# Patient Record
Sex: Female | Born: 1975 | Race: Asian | Hispanic: No | Marital: Married | Smoking: Never smoker
Health system: Southern US, Community
[De-identification: ages and names within clinical notes are randomized; demographics above are authoritative.]

---

## 2012-01-23 ENCOUNTER — Ambulatory Visit: Payer: Self-pay | Admitting: Physician Assistant

## 2012-01-23 VITALS — BP 113/75 | HR 67 | Temp 98.6°F | Resp 16 | Ht 61.0 in | Wt 113.6 lb

## 2012-01-23 DIAGNOSIS — J029 Acute pharyngitis, unspecified: Secondary | ICD-10-CM

## 2012-01-23 DIAGNOSIS — J40 Bronchitis, not specified as acute or chronic: Secondary | ICD-10-CM

## 2012-01-23 MED ORDER — HYDROCODONE-HOMATROPINE 5-1.5 MG/5ML PO SYRP: ORAL_SOLUTION | ORAL | Status: AC

## 2012-01-23 MED ORDER — CEFDINIR 300 MG PO CAPS: 300.0000 mg | ORAL_CAPSULE | Freq: Two times a day (BID) | ORAL | Status: AC

## 2012-01-23 NOTE — Progress Notes (Signed)
Patient ID: Sonia Santiago MRN: [id number], DOB: [birth year], 36 y.o. Date of Encounter: 01/23/2012, 11:56 AM  Primary Physician: No primary provider on file.  Chief Complaint:  Chief Complaint  Patient presents with  . Sore Throat    x 2 weeks  . Fever  . Cough    HPI: 37 y.o. year old female presents with a 14 day history of nasal congestion, post nasal drip, sore throat, sinus pressure, and cough. Afebrile. No chills. Nasal congestion thick and green/yellow. Cough is mildly productive of green/yellow sputum and not associated with time of day. Ears feel full, leading to sensation of muffled hearing. Has tried OTC cold preps without success. No GI complaints. Appetite normal.  No sick contacts, recent antibiotics, or recent travels.   No leg trauma, sedentary periods, h/o cancer, or tobacco use.  History reviewed. No pertinent past medical history.   Home Meds: Prior to Admission medications   Medication Sig Start Date End Date Taking? Authorizing Provider  cefdinir (OMNICEF) 300 MG capsule Take 1 capsule (300 mg total) by mouth 2 (two) times daily. 01/23/12 02/02/12  Kory Panjwani M Leasia Swann, PA-C  HYDROcodone-homatropine (HYCODAN) 5-1.5 MG/5ML syrup 1 TSP PO Q 4-6 HOURS PRN COUGH 01/23/12 02/02/12  Sondra Barges, PA-C    Allergies: No Known Allergies  History   Social History  . Marital Status: Married    Spouse Name: N/A    Number of Children: N/A  . Years of Education: N/A   Occupational History  . Not on file.   Social History Main Topics  . Smoking status: Never Smoker   . Smokeless tobacco: Not on file  . Alcohol Use: Not on file  . Drug Use: Not on file  . Sexually Active: Not on file   Other Topics Concern  . Not on file   Social History Narrative  . No narrative on file     Review of Systems: Constitutional: negative for chills, fever, night sweats or weight changes Cardiovascular: negative for chest pain or palpitations Respiratory: negative for hemoptysis,  wheezing, or shortness of breath Abdominal: negative for abdominal pain, nausea, vomiting or diarrhea Dermatological: negative for rash Neurologic: negative for headache   Physical Exam: Blood pressure 113/75, pulse 67, temperature 98.6 F (37 C), temperature source Oral, resp. rate 16, height 5\' 1"  (1.549 m), weight 113 lb 9.6 oz (51.529 kg), last menstrual period 01/21/2012., Body mass index is 21.46 kg/(m^2). General: Well developed, well nourished, in no acute distress. Head: Normocephalic, atraumatic, eyes without discharge, sclera non-icteric, nares are congested. Bilateral auditory canals clear, TM's are without perforation, pearly grey with reflective cone of light bilaterally. Maxillary sinus TTP. Oral cavity moist, dentition normal. Posterior pharynx with post nasal drip and mild erythema. No peritonsillar abscess or tonsillar exudate. Neck: Supple. No thyromegaly. Full ROM. No lymphadenopathy. Lungs: Coarse breath sounds bilaterally without wheezes, rales, or rhonchi. Breathing is unlabored.  Heart: RRR with S1 S2. No murmurs, rubs, or gallops appreciated. Msk:  Strength and tone normal for age. Extremities: No clubbing or cyanosis. No edema. Neuro: Alert and oriented X 3. Moves all extremities spontaneously. CNII-XII grossly in tact. Psych:  Responds to questions appropriately with a normal affect.   Labs: Deferred secondary financial issues  ASSESSMENT AND PLAN:  36 y.o. year old female with sinobronchitis -Omnicef 300 mg 1 po bid #20 no RF  -Hycodan #4oz 1 tsp po q 4-6 hours prn cough no RF SED -Mucinex -Tylenol/Motrin prn -Rest/fluids -RTC precautions -RTC 3-5 days if  no improvement  Signed, Eula Listen, PA-C 01/23/2012 11:56 AM

## 2012-03-11 ENCOUNTER — Ambulatory Visit: Payer: Self-pay | Admitting: Family Medicine

## 2012-03-11 VITALS — BP 110/76 | HR 87 | Temp 98.1°F | Resp 16 | Ht 60.5 in | Wt 112.0 lb

## 2012-03-11 DIAGNOSIS — R599 Enlarged lymph nodes, unspecified: Secondary | ICD-10-CM

## 2012-03-11 DIAGNOSIS — IMO0001 Reserved for inherently not codable concepts without codable children: Secondary | ICD-10-CM

## 2012-03-11 DIAGNOSIS — R591 Generalized enlarged lymph nodes: Secondary | ICD-10-CM

## 2012-03-11 DIAGNOSIS — M791 Myalgia, unspecified site: Secondary | ICD-10-CM

## 2012-03-11 DIAGNOSIS — R5383 Other fatigue: Secondary | ICD-10-CM

## 2012-03-11 DIAGNOSIS — R5381 Other malaise: Secondary | ICD-10-CM

## 2012-03-11 LAB — POCT CBC
Granulocyte percent: 60.3 %G (ref 37–80)
HCT, POC: 40.4 % (ref 37.7–47.9)
Hemoglobin: 12.6 g/dL (ref 12.2–16.2)
Lymph, poc: 2 (ref 0.6–3.4)
MCH, POC: 25.8 pg — AB (ref 27–31.2)
MCHC: 31.2 g/dL — AB (ref 31.8–35.4)
MCV: 82.7 fL (ref 80–97)
MID (cbc): 0.7 (ref 0–0.9)
MPV: 9.7 fL (ref 0–99.8)
POC Granulocyte: 4.1 (ref 2–6.9)
POC LYMPH PERCENT: 29.6 %L (ref 10–50)
POC MID %: 10.1 %M (ref 0–12)
Platelet Count, POC: 313 10*3/uL (ref 142–424)
RBC: 4.88 M/uL (ref 4.04–5.48)
RDW, POC: 14.6 %
WBC: 6.8 10*3/uL (ref 4.6–10.2)

## 2012-03-11 LAB — CK: Total CK: 39 U/L (ref 7–177)

## 2012-03-11 LAB — COMPREHENSIVE METABOLIC PANEL
BUN: 14 mg/dL (ref 6–23)
CO2: 26 mEq/L (ref 19–32)
Calcium: 8.9 mg/dL (ref 8.4–10.5)
Chloride: 108 mEq/L (ref 96–112)
Creat: 0.77 mg/dL (ref 0.50–1.10)
Total Bilirubin: 0.3 mg/dL (ref 0.3–1.2)

## 2012-03-11 LAB — POCT RAPID STREP A (OFFICE): Rapid Strep A Screen: NEGATIVE

## 2012-03-11 LAB — TSH: TSH: 1.306 u[IU]/mL (ref 0.350–4.500)

## 2012-03-11 NOTE — Progress Notes (Signed)
Subjective:    Patient ID: Sonia Santiago, female    DOB: 03/27/1976, 36 y.o.   MRN: [id number]  HPI Sonia Santiago is a 36 y.o. female Pain in front L neck for past 2-3 days, more sore if not taking ibuprofen.  Hot and cold at times, but has not checked temperature.  Feels like not taking deep breath if drinking more than few drinks of coffee per day.   Muscle aches, tight?  for a long time - few months, feels tired past 2-3 weeks  No pets at home.  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     Review of Systems  HENT: Negative for congestion, sore throat, rhinorrhea and trouble swallowing.   Musculoskeletal: Positive for myalgias.       Objective:   Physical Exam  Constitutional: She is oriented to person, place, and time. She appears well-developed and well-nourished. No distress.  HENT:  Head: Normocephalic and atraumatic.  Right Ear: External ear normal.  Left Ear: External ear normal.  Mouth/Throat: Oropharynx is clear and moist.  Eyes: Conjunctivae and EOM are normal. Pupils are equal, round, and reactive to light.  Neck: Normal range of motion. Neck supple. No tracheal deviation present. No thyromegaly present.    Cardiovascular: Normal rate, regular rhythm, normal heart sounds and intact distal pulses.   Pulmonary/Chest: Effort normal and breath sounds normal.  Abdominal: Soft. Bowel sounds are normal. She exhibits no mass. There is no tenderness. There is no rebound and no guarding.  Musculoskeletal: Normal range of motion. She exhibits no edema.  Lymphadenopathy:    She has cervical adenopathy.  Neurological: She is alert and oriented to person, place, and time.  Skin: Skin is warm.  Psychiatric: She has a normal mood and affect. Her behavior is normal.   Results for orders placed in visit on 03/11/12  POCT RAPID STREP A (OFFICE)      Component Value Range   Rapid Strep A Screen Negative  Negative   POCT CBC      Component Value Range   WBC 6.8  4.6 - 10.2 (K/uL)   Lymph, poc 2.0  0.6 - 3.4    POC LYMPH PERCENT 29.6  10 - 50 (%L)   MID (cbc) 0.7  0 - 0.9    POC MID % 10.1  0 - 12 (%M)   POC Granulocyte 4.1  2 - 6.9    Granulocyte percent 60.3  37 - 80 (%G)   RBC 4.88  4.04 - 5.48 (M/uL)   Hemoglobin 12.6  12.2 - 16.2 (g/dL)   HCT, POC 40.9  81.1 - 47.9 (%)   MCV 82.7  80 - 97 (fL)   MCH, POC 25.8 (*) 27 - 31.2 (pg)   MCHC 31.2 (*) 31.8 - 35.4 (g/dL)   RDW, POC 91.4     Platelet Count, POC 313  142 - 424 (K/uL)   MPV 9.7  0 - 99.8 (fL)        Assessment & Plan:  Sonia Santiago is a 36 y.o. female 1. Lymphadenopathy  POCT rapid strep A, POCT CBC  2. Malaise and fatigue  POCT CBC, Comprehensive metabolic panel, TSH, CK, Vitamin D 1,25 dihydroxy  3. Myalgia  POCT CBC, CK   Reassuring cbc and exam.  May have viral infection causing LAD.  Sx care, advil prn, but if any fever or worsening - rtc. If not worsening, but no change in LAD in 5 days - can call in amox 875bid x 10days.  Fatigue, myalgias - check BMP, TSH, CK, vitamin D level.  RTC precautions given - understanding expressed.

## 2012-03-11 NOTE — Patient Instructions (Signed)
You may have a virus that causes swollen lymph nodes.  advil as needed for next few days.  If not improving in next 5 days, call for possible antibiotic.  If any worsening sooner (including any fever), return to clinic.   Your should receive a call or letter about your lab results within the next week to 10 days.

## 2012-03-15 LAB — VITAMIN D 1,25 DIHYDROXY
Vitamin D 1, 25 (OH)2 Total: 51 pg/mL (ref 18–72)
Vitamin D2 1, 25 (OH)2: <8 pg/mL
Vitamin D3 1, 25 (OH)2: 51 pg/mL

## 2012-03-17 ENCOUNTER — Telehealth: Payer: Self-pay

## 2012-03-17 NOTE — Telephone Encounter (Signed)
PT STATES THAT SHE IS NOT ANY BETTER AND IS STILL HAVING THE SAME SYMPTOMS, PLEASE ADVISE

## 2012-11-22 ENCOUNTER — Ambulatory Visit (INDEPENDENT_AMBULATORY_CARE_PROVIDER_SITE_OTHER): Payer: BC Managed Care – PPO | Admitting: Emergency Medicine

## 2012-11-22 VITALS — BP 116/74 | HR 88 | Temp 101.7°F | Resp 16 | Ht 61.0 in | Wt 116.0 lb

## 2012-11-22 DIAGNOSIS — J020 Streptococcal pharyngitis: Secondary | ICD-10-CM

## 2012-11-22 MED ORDER — CEFPROZIL 500 MG PO TABS: 500.0000 mg | ORAL_TABLET | Freq: Two times a day (BID) | ORAL | Status: AC

## 2012-11-22 NOTE — Progress Notes (Signed)
[facility] [address] [place] [address] [phone]  Date:  11/22/2012   Name:  Leondra Cullin   DOB:  1976/06/30   MRN:  [id number]  PCP:  No primary provider on file.    Chief Complaint: Fatigue, Sore Throat, Acne and Fever   History of Present Illness:  Lorisa Scheid is a 37 y.o. very pleasant female patient who presents with the following:  Ill since yesterday with fever and chills, sore throat. No cough or coryza, shortness of breath or wheezing.  No nausea or vomiting.  Fatigued.  Malaise.  No sick contacts. No improvement with OTC medication.s  There is no problem list on file for this patient.   No past medical history on file.  No past surgical history on file.  History  Substance Use Topics  . Smoking status: Never Smoker   . Smokeless tobacco: Not on file  . Alcohol Use: Not on file    No family history on file.  No Known Allergies  Medication list has been reviewed and updated.  Current Outpatient Prescriptions on File Prior to Visit  Medication Sig Dispense Refill  . ibuprofen (ADVIL,MOTRIN) 200 MG tablet Take 200 mg by mouth every 6 (six) hours as needed.        Review of Systems:  As per HPI, otherwise negative.    Physical Examination: Filed Vitals:   11/22/12 1958  BP: 116/74  Pulse: 88  Temp: 101.7 F (38.7 C)  Resp: 16   Filed Vitals:   11/22/12 1958  Height: 5\' 1"  (1.549 m)  Weight: 116 lb (52.617 kg)   Body mass index is 21.92 kg/(m^2). Ideal Body Weight: Weight in (lb) to have BMI = 25: 132   GEN: WDWN, NAD, Non-toxic, A & O x 3 HEENT: Atraumatic, Normocephalic. Neck supple. No masses, No LAD.  Oropharynx red with exudate Ears and Nose: No external deformity. CV: RRR, No M/G/R. No JVD. No thrill. No extra heart sounds. PULM: CTA B, no wheezes, crackles, rhonchi. No retractions. No resp. distress. No accessory muscle use. ABD: S, NT, ND, +BS. No rebound. No HSM. EXTR: No c/c/e NEURO Normal gait.   PSYCH: Normally interactive. Conversant. Not depressed or anxious appearing.  Calm demeanor.    Assessment and Plan: Strep pharyngitis cefzil Tylenol Follow up as needed  Carmelina Dane, MD

## 2012-11-22 NOTE — Patient Instructions (Signed)
Sonia Santiago (Strep Throat)  Sonia Santiago l b?nh nhi?m trng c? Santiago gy ra b?i m?t lo?i vi khu?n c tn l Streptococcus pyogenes. Chuyn gia ch?m Rippey y t? c th? g?i nhi?m trng lin Santiago l "Sonia ami?an" ho?c "Sonia Santiago" ty thu?c vo vi?c c d?u hi?u Sonia trong ami?an hay vng pha sau c? Santiago. Sonia Santiago ph? bi?n nh?t ? tr? em t? 5 ??n 15 tu?i vo nh?ng thng l?nh c?a n?m, nh?ng n c th? x?y ra ? b?t k? ?? tu?i no vo b?t c? ma no. B?nh ny ly t? ng??i sang ng??i (d? ly) thng qua h?t h?i, ho ho?c ti?p xc g?n g?i khc.  TRI?U CH?NG  S?t ho?c ?n l?nh.  Ami?an ho?c c? Santiago ?au, s?ng, t?y ??.  ?au ho?c kh kh?n khi nu?t.  C cc ??m tr?ng ho?c vng trn ami?an ho?c c? Santiago.  Cc h?ch b?ch huy?t hay "cc tuy?n h?ch" ? c? ho?c d??i quai hm b? s?ng, ?au khi b? ch?m vo.  N?i ban ?? lan kh?p c? th? (b?t th??ng). CH?N ?ON Nhi?u b?nh nhi?m trng khc nhau c th? gy ra cc tri?u ch?ng t??ng t?. C?n ti?n hnh xt nghi?m ?? xc nh?n ch?n ?on ?? ??a ra cch ?i?u tr? ph h?p. "Ki?m tra khu?n lin Santiago nhanh" c th? gip chuyn gia ch?m Sonia Santiago y t? ch?n ?on trong vi pht. N?u xt nghi?m ny l khng c s?n, m?t t?m bng t?m nh? c?a vng b? nhi?m b?nh c th? ???c s? d?ng cho m?t xt nghi?m c?y m?u c? Santiago. N?u m?t xt nghi?m c?y m?u c? Santiago ???c th?c hi?n, th??ng c k?t qu? trong m?t ho?c hai ngy.  ?I?U TR? Sonia Santiago lin Santiago khu?n ???c ?i?u tr? b?ng thu?c khng sinh. H??NG D?N CH?M Sonia Santiago T?I NH  Sonia Santiago mi?ng v?i 1 ly n??c ?m c pha 1 mu?ng mu?i, 3 ??n 4 l?n m?i ngy ho?c khi c?n thi?t ?? d? ch?u.  Nh?ng thnh vin gia ?nh c?ng b? ?au Santiago ho?c s?t c?n ???c ki?m tra Sonia Santiago v ?i?u tr? b?ng thu?c khng sinh xem h? cos b? Sonia Santiago khng.  Hy ch?c ch?n r?ng t?t c? m?i ng??i trong gia ?nh b?n r?a tay s?ch s?Imagene Sheller dng chung th?c ?n, ly tch ho?c cc v?t d?ng c nhn c th? ly nhi?m cho ng??i khc.  B?n c th? c?n p  d?ng ch? ?? ?n u?ng c th?c ?n m?m cho ??n khi ?au Santiago thuyn gi?m.  U?ng ?? n??c v dung d?ch ?? n??c ti?u trong ho?c c mu vng nh?t. Lm nh? v?y s? gip ng?n ng?a m?t n??c.  Ngh? ng?i th?t nhi?u.  Ngh? h?c ho?c ngh? lm v ? nh cho ??n khi b?n ? s? d?ng thu?c khng sinh trong 24 gi?Marland Kitchen  Ch? dng cc thu?c thng th??ng ho?c ???c k toa ?? gi?m ?au, gi?m kh ch?u, ho?c s?t theo h??ng d?n c?a Bc s?.  N?u khng sinh ???c ch? ??nh, hy s? d?ng chng theo ch? d?n. Dng h?t chng ngay c? khi b?n b?t ??u c?m th?y t?t h?n. ?I KHM B?NH N?U:  Cc tuy?n ? c? c?a b?n ti?p t?c phnh to.  B? pht ban, ho, ho?c ?au tai.  Ho ra ??m mu xanh, nu vng, ho?c c l?n mu.  Khng gi?m ?au ho?c kh ch?u sau khi dng thu?c.  Cc v?n ?? d??ng  nh? tr? nn t? h?n thay v t?t h?n. HY NGAY L?P T?C THAM V?N V?I CHUYN GIA Y T? N?U:  Xu?t hi?n b?t k? tri?u ch?ng m?i no nh? nn m?a, ?au ??u d? d?i, ?au hay c?ng c?, ?au ng?c, kh th?, c v?n ?? khi th?, ho?c khi nu?t.  ?au c? Santiago d? d?i, ch?y n??c di, ho?c thay ??i gi?ng ni.  S?ng c?, ho?c da vng c? tr? nn ?? v ?au khi b? ch?m vo.  B?n b? s?t.  B?n pht tri?n cc d?u hi?u m?t n??c, ch?ng h?n nh? m?t m?i, kh mi?ng v gi?m ?i ti?u.  B?n ngy cng tr? nn bu?n ng? ho?c b?n khng c th? t?nh hon ton. Document Released: 10/03/2005 Document Revised: 12/26/2011 [facility] Patient Information 2013 [place], [place].

## 2014-02-13 ENCOUNTER — Ambulatory Visit (INDEPENDENT_AMBULATORY_CARE_PROVIDER_SITE_OTHER): Payer: BC Managed Care – PPO | Admitting: Family Medicine

## 2014-02-13 VITALS — BP 102/60 | HR 71 | Temp 98.4°F | Resp 18 | Ht 59.0 in | Wt 120.0 lb

## 2014-02-13 DIAGNOSIS — R05 Cough: Secondary | ICD-10-CM

## 2014-02-13 DIAGNOSIS — J309 Allergic rhinitis, unspecified: Secondary | ICD-10-CM

## 2014-02-13 DIAGNOSIS — J302 Other seasonal allergic rhinitis: Secondary | ICD-10-CM

## 2014-02-13 DIAGNOSIS — R059 Cough, unspecified: Secondary | ICD-10-CM

## 2014-02-13 DIAGNOSIS — J069 Acute upper respiratory infection, unspecified: Secondary | ICD-10-CM

## 2014-02-13 MED ORDER — AMOXICILLIN 875 MG PO TABS: 875.0000 mg | ORAL_TABLET | Freq: Two times a day (BID) | ORAL | Status: DC

## 2014-02-13 MED ORDER — HYDROCODONE-HOMATROPINE 5-1.5 MG/5ML PO SYRP: 5.0000 mL | ORAL_SOLUTION | Freq: Every evening | ORAL | Status: DC | PRN

## 2014-02-13 MED ORDER — BENZONATATE 100 MG PO CAPS: 200.0000 mg | ORAL_CAPSULE | Freq: Two times a day (BID) | ORAL | Status: DC | PRN

## 2014-02-13 NOTE — Progress Notes (Signed)
Chief Complaint:  Chief Complaint  Patient presents with  . Cough    x2 weeks sometimes dry and yellowish mucous     HPI: Sonia Santiago is a 38 y.o. female who is here for  2 week history of  productive yellow cough which is slightly improved, she has had chills, had sore throat, took advil and tylenol without releif, her cild has ahd cold sxs as well.  No SOB, CP, wheezing, no asthma. She denies allergies/ eczema, she works in a [employer] and may have allergies to those chemical but this never happened before. No sinus tenderness.   History reviewed. No pertinent past medical history. Past Surgical History  Procedure Laterality Date  . Cesarean section     History   Social History  . Marital Status: Married    Spouse Name: N/A    Number of Children: N/A  . Years of Education: N/A   Social History Main Topics  . Smoking status: Never Smoker   . Smokeless tobacco: None  . Alcohol Use: None  . Drug Use: None  . Sexual Activity: None   Other Topics Concern  . None   Social History Narrative  . None   History reviewed. No pertinent family history. No Known Allergies Prior to Admission medications   Medication Sig Start Date End Date Taking? Authorizing Provider  ibuprofen (ADVIL,MOTRIN) 200 MG tablet Take 200 mg by mouth every 6 (six) hours as needed.   Yes Historical Provider, MD     ROS: The patient denies fevers, night sweats, unintentional weight loss, chest pain, palpitations, wheezing, dyspnea on exertion, nausea, vomiting, abdominal pain, dysuria, hematuria, melena, numbness, weakness, or tingling.   All other systems have been reviewed and were otherwise negative with the exception of those mentioned in the HPI and as above.    PHYSICAL EXAM: Filed Vitals:   02/13/14 1738  BP: 102/60  Pulse: 71  Temp: 98.4 F (36.9 C)  Resp: 18   Filed Vitals:   02/13/14 1738  Height: 4\' 11"  (1.499 m)  Weight: 120 lb (54.432 kg)   Body mass index is 24.22  kg/(m^2).  General: Alert, no acute distress HEENT:  Normocephalic, atraumatic, oropharynx patent. EOMI, PERRLA, tm nl, minimally erythematous throat, no exudates, no sinus tenderness,nares normal , + minimal PND Cardiovascular:  Regular rate and rhythm, no rubs murmurs or gallops.  No Carotid bruits, radial pulse intact. No pedal edema.  Respiratory: Clear to auscultation bilaterally.  No wheezes, rales, or rhonchi.  No cyanosis, no use of accessory musculature GI: No organomegaly, abdomen is soft and non-tender, positive bowel sounds.  No masses. Skin: No rashes. Neurologic: Facial musculature symmetric. Psychiatric: Patient is appropriate throughout our interaction. Lymphatic: No cervical lymphadenopathy Musculoskeletal: Gait intact.   LABS: Results for orders placed in visit on 03/11/12  COMPREHENSIVE METABOLIC PANEL      Result Value Ref Range   Sodium 141  135 - 145 mEq/L   Potassium 4.5  3.5 - 5.3 mEq/L   Chloride 108  96 - 112 mEq/L   CO2 26  19 - 32 mEq/L   Glucose, Bld 69 (*) 70 - 99 mg/dL   BUN 14  6 - 23 mg/dL   Creat 6.960.77  2.950.50 - 2.841.10 mg/dL   Total Bilirubin 0.3  0.3 - 1.2 mg/dL   Alkaline Phosphatase 68  39 - 117 U/L   AST 12  0 - 37 U/L   ALT 8  0 - 35  U/L   Total Protein 7.6  6.0 - 8.3 g/dL   Albumin 4.0  3.5 - 5.2 g/dL   Calcium 8.9  8.4 - 16.110.5 mg/dL  TSH      Result Value Ref Range   TSH 1.306  0.350 - 4.500 uIU/mL  CK      Result Value Ref Range   Total CK 39  7 - 177 U/L  VITAMIN D 1,25 DIHYDROXY      Result Value Ref Range   Vitamin D 1, 25 (OH)2 Total 51  18 - 72 pg/mL   Vitamin D3 1, 25 (OH)2 51     Vitamin D2 1, 25 (OH)2 <8    POCT RAPID STREP A (OFFICE)      Result Value Ref Range   Rapid Strep A Screen Negative  Negative  POCT CBC      Result Value Ref Range   WBC 6.8  4.6 - 10.2 K/uL   Lymph, poc 2.0  0.6 - 3.4   POC LYMPH PERCENT 29.6  10 - 50 %L   MID (cbc) 0.7  0 - 0.9   POC MID % 10.1  0 - 12 %M   POC Granulocyte 4.1  2 - 6.9    Granulocyte percent 60.3  37 - 80 %G   RBC 4.88  4.04 - 5.48 M/uL   Hemoglobin 12.6  12.2 - 16.2 g/dL   HCT, POC 09.640.4  04.537.7 - 47.9 %   MCV 82.7  80 - 97 fL   MCH, POC 25.8 (*) 27 - 31.2 pg   MCHC 31.2 (*) 31.8 - 35.4 g/dL   RDW, POC 40.914.6     Platelet Count, POC 313  142 - 424 K/uL   MPV 9.7  0 - 99.8 fL     EKG/XRAY:   Primary read [facility]   ASSESSMENT/PLAN: Encounter Diagnoses  Name Primary?  . URI, acute Yes  . Cough   . Seasonal allergies    Post viral vs allergies vs bacterial URI Claritin or zyrtec, if no improvement sxs treatemnt then may try amoxacillin  Rx Tessalon perles, hycodan Rx Amoxacillin F/u prn  Gross sideeffects, risk and benefits, and alternatives of medications d/w patient. Patient is aware that all medications have potential sideeffects and we are unable to predict every sideeffect or drug-drug interaction that may occur.  Lenell Antuhao P Makylah Bossard, DO 02/13/2014 6:32 PM

## 2014-11-18 ENCOUNTER — Ambulatory Visit (INDEPENDENT_AMBULATORY_CARE_PROVIDER_SITE_OTHER): Payer: 59

## 2014-11-18 ENCOUNTER — Ambulatory Visit (INDEPENDENT_AMBULATORY_CARE_PROVIDER_SITE_OTHER): Payer: 59 | Admitting: Emergency Medicine

## 2014-11-18 VITALS — BP 110/68 | HR 87 | Temp 98.5°F | Resp 18 | Ht 61.0 in | Wt 127.0 lb

## 2014-11-18 DIAGNOSIS — R05 Cough: Secondary | ICD-10-CM

## 2014-11-18 DIAGNOSIS — R059 Cough, unspecified: Secondary | ICD-10-CM

## 2014-11-18 DIAGNOSIS — N946 Dysmenorrhea, unspecified: Secondary | ICD-10-CM

## 2014-11-18 DIAGNOSIS — J209 Acute bronchitis, unspecified: Secondary | ICD-10-CM

## 2014-11-18 LAB — POCT URINALYSIS DIPSTICK
Bilirubin, UA: NEGATIVE
GLUCOSE UA: NEGATIVE
Ketones, UA: NEGATIVE
Leukocytes, UA: NEGATIVE
Nitrite, UA: NEGATIVE
Spec Grav, UA: 1.02
Urobilinogen, UA: 0.2
pH, UA: 6

## 2014-11-18 LAB — POCT URINE PREGNANCY: PREG TEST UR: NEGATIVE

## 2014-11-18 LAB — POCT UA - MICROSCOPIC ONLY
Bacteria, U Microscopic: NEGATIVE
CASTS, UR, LPF, POC: NEGATIVE
Crystals, Ur, HPF, POC: NEGATIVE
Mucus, UA: NEGATIVE
YEAST UA: NEGATIVE

## 2014-11-18 MED ORDER — IBUPROFEN 600 MG PO TABS: 600.0000 mg | ORAL_TABLET | Freq: Three times a day (TID) | ORAL | Status: AC | PRN

## 2014-11-18 MED ORDER — HYDROCOD POLST-CHLORPHEN POLST 10-8 MG/5ML PO LQCR: 5.0000 mL | Freq: Two times a day (BID) | ORAL | Status: DC | PRN

## 2014-11-18 MED ORDER — AZITHROMYCIN 250 MG PO TABS: ORAL_TABLET | ORAL | Status: DC

## 2014-11-18 NOTE — Patient Instructions (Signed)

## 2014-11-18 NOTE — Progress Notes (Signed)
[facility] [address], [place] [id number] [phone]  Date:  11/18/2014   Name:  Sonia Santiago   DOB:  Nov 18, 1975   MRN:  [id number]  PCP:  No primary care provider on file.   Poor command of english language.  Translation by husband  Chief Complaint: Cough and painful menses   History of Present Illness:  Sonia Santiago is a 39 y.o. very pleasant female patient who presents with the following:  History of cough for past 3-4 weeks.  Productive mucopurulent sputum. No wheezing or shortness of breath No fever or chills. No nausea or vomiting No stool change No rash. No sore throat or coryza Has menstrual cramping past few months. No improvement with over the counter medications or other home remedies.  Denies other complaint or health concern today.    There are no active problems to display for this patient.   History reviewed. No pertinent past medical history.  Past Surgical History  Procedure Laterality Date  . Cesarean section      History  Substance Use Topics  . Smoking status: Never Smoker   . Smokeless tobacco: Not on file  . Alcohol Use: Not on file    History reviewed. No pertinent family history.  No Known Allergies  Medication list has been reviewed and updated.  Current Outpatient Prescriptions on File Prior to Visit  Medication Sig Dispense Refill  . amoxicillin (AMOXIL) 875 MG tablet Take 1 tablet (875 mg total) by mouth 2 (two) times daily. (Patient not taking: Reported on 11/18/2014) 20 tablet 0  . benzonatate (TESSALON) 100 MG capsule Take 2 capsules (200 mg total) by mouth 2 (two) times daily as needed for cough. (Patient not taking: Reported on 11/18/2014) 30 capsule 1  . HYDROcodone-homatropine (HYCODAN) 5-1.5 MG/5ML syrup Take 5 mLs by mouth at bedtime as needed for cough. (Patient not taking: Reported on 11/18/2014) 120 mL 0  . ibuprofen (ADVIL,MOTRIN) 200 MG tablet Take 200 mg by mouth every 6 (six) hours as needed.      No current facility-administered medications on file prior to visit.    Review of Systems:  As per HPI, otherwise negative.    Physical Examination: Filed Vitals:   11/18/14 0832  BP: 110/68  Pulse: 87  Temp: 98.5 F (36.9 C)  Resp: 18   Filed Vitals:   11/18/14 0832  Height: 5\' 1"  (1.549 m)  Weight: 127 lb (57.607 kg)   Body mass index is 24.01 kg/(m^2). Ideal Body Weight: Weight in (lb) to have BMI = 25: 132  GEN: WDWN, NAD, Non-toxic, A & O x 3 HEENT: Atraumatic, Normocephalic. Neck supple. No masses, No LAD. Ears and Nose: No external deformity. CV: RRR, No M/G/R. No JVD. No thrill. No extra heart sounds. PULM: CTA B, no wheezes, crackles, rhonchi. No retractions. No resp. distress. No accessory muscle use. ABD: S, NT, ND, +BS. No rebound. No HSM. EXTR: No c/c/e NEURO Normal gait.  PSYCH: Normally interactive. Conversant. Not depressed or anxious appearing.  Calm demeanor.    Assessment and Plan: Bronchitis zpak  tussionex  Signed,  Phillips OdorJeffery Demarie Hyneman, MD  Results for orders placed or performed in visit on 11/18/14  POCT urine pregnancy  Result Value Ref Range   Preg Test, Ur Negative   POCT urinalysis dipstick  Result Value Ref Range   Color, UA yellow    Clarity, UA cloudy    Glucose, UA neg    Bilirubin, UA neg  Ketones, UA neg    Spec Grav, UA 1.020    Blood, UA large    pH, UA 6.0    Protein, UA trace    Urobilinogen, UA 0.2    Nitrite, UA neg    Leukocytes, UA Negative   POCT UA - Microscopic Only  Result Value Ref Range   WBC, Ur, HPF, POC 0-2    RBC, urine, microscopic TNTC    Bacteria, U Microscopic neg    Mucus, UA neg    Epithelial cells, urine per micros 0-2    Crystals, Ur, HPF, POC neg    Casts, Ur, LPF, POC neg    Yeast, UA neg    [facility] reading (PRIMARY) by  Dr. Dareen Piano  Negative chest.

## 2015-04-21 ENCOUNTER — Ambulatory Visit (INDEPENDENT_AMBULATORY_CARE_PROVIDER_SITE_OTHER): Payer: 59 | Admitting: Physician Assistant

## 2015-04-21 VITALS — BP 128/74 | HR 76 | Temp 98.1°F | Resp 17 | Ht 60.0 in | Wt 127.0 lb

## 2015-04-21 DIAGNOSIS — J069 Acute upper respiratory infection, unspecified: Secondary | ICD-10-CM | POA: Diagnosis not present

## 2015-04-21 DIAGNOSIS — R05 Cough: Secondary | ICD-10-CM

## 2015-04-21 DIAGNOSIS — R21 Rash and other nonspecific skin eruption: Secondary | ICD-10-CM

## 2015-04-21 DIAGNOSIS — R070 Pain in throat: Secondary | ICD-10-CM

## 2015-04-21 DIAGNOSIS — R059 Cough, unspecified: Secondary | ICD-10-CM

## 2015-04-21 LAB — POCT RAPID STREP A (OFFICE): Rapid Strep A Screen: NEGATIVE

## 2015-04-21 MED ORDER — TRIAMCINOLONE ACETONIDE 0.1 % EX CREA: 1.0000 "application " | TOPICAL_CREAM | Freq: Two times a day (BID) | CUTANEOUS | Status: DC

## 2015-04-21 MED ORDER — HYDROCOD POLST-CPM POLST ER 10-8 MG/5ML PO SUER: 5.0000 mL | Freq: Every evening | ORAL | Status: DC | PRN

## 2015-04-21 MED ORDER — GUAIFENESIN ER 1200 MG PO TB12: 1.0000 | ORAL_TABLET | Freq: Two times a day (BID) | ORAL | Status: DC | PRN

## 2015-04-21 MED ORDER — BENZONATATE 100 MG PO CAPS: 200.0000 mg | ORAL_CAPSULE | Freq: Three times a day (TID) | ORAL | Status: DC | PRN

## 2015-04-21 NOTE — Progress Notes (Signed)
[facility] [address], [place] [id number] [phone]  Date:  04/21/2015   Name:  Baltazar NajjarKhonh Gunning   DOB:  09-Feb-1976   MRN:  [id number]  PCP:  No primary care provider on file.    History of Present Illness:  Baltazar NajjarKhonh Mackintosh is a 39 y.o. female patient who [facility] for chief complaint of sore throat, cough, and ear pain for the last 4 days. Patient states that her symptoms began with a sore throat and nasal congestion. She also has bilateral ear pain behind the ears. There is no associated dizziness. She is coughing at times a clear productive sputum. She has no shortness of breath or dyspnea. She has fatigue but no body aches. She has no fever. She has taken Advil for little relief.  She states that she is more concerned of the sore throat, stating that it can hurt a lot some days.   She also states that about a week ago she developed a rash on her left posterior knee. It is pruritic and red and swollen. She's used hydrocortisone which has improved the rash significantly and she no longer has the itching for the pain. She she states that she was outdoors in her yard, but did not do much gardening. She was sitting at a swivel chair in her home when she noticed the rash. There has been no rash on any other members of the home. They have no pets. There is been no outbreak of rash anywhere else on her body. This was not associated with body aches, chills, nausea, vomiting, diarrhea, or headache.    There are no active problems to display for this patient.   History reviewed. No pertinent past medical history.  Past Surgical History  Procedure Laterality Date  . Cesarean section      History  Substance Use Topics  . Smoking status: Never Smoker   . Smokeless tobacco: Not on file  . Alcohol Use: Not on file    History reviewed. No pertinent family history.  No Known Allergies  Medication list has been reviewed and updated.  Current Outpatient Prescriptions  on File Prior to Visit  Medication Sig Dispense Refill  . ibuprofen (ADVIL,MOTRIN) 200 MG tablet Take 200 mg by mouth every 6 (six) hours as needed.    Marland Kitchen. ibuprofen (ADVIL,MOTRIN) 600 MG tablet Take 1 tablet (600 mg total) by mouth every 8 (eight) hours as needed. For menstrual cramps 30 tablet 3  . amoxicillin (AMOXIL) 875 MG tablet Take 1 tablet (875 mg total) by mouth 2 (two) times daily. (Patient not taking: Reported on 11/18/2014) 20 tablet 0  . azithromycin (ZITHROMAX) 250 MG tablet Take 2 tabs PO x 1 dose, then 1 tab PO QD x 4 days 6 tablet 0  . benzonatate (TESSALON) 100 MG capsule Take 2 capsules (200 mg total) by mouth 2 (two) times daily as needed for cough. (Patient not taking: Reported on 11/18/2014) 30 capsule 1  . chlorpheniramine-HYDROcodone (TUSSIONEX PENNKINETIC ER) 10-8 MG/5ML LQCR Take 5 mLs by mouth every 12 (twelve) hours as needed. 60 mL 0  . HYDROcodone-homatropine (HYCODAN) 5-1.5 MG/5ML syrup Take 5 mLs by mouth at bedtime as needed for cough. (Patient not taking: Reported on 11/18/2014) 120 mL 0   No current facility-administered medications on file prior to visit.    ROS ROS otherwise unremarkable unless listed above.  Physical Examination: BP 128/74 mmHg  Pulse 76  Temp(Src) 98.1 F (36.7 C) (Oral)  Resp  17  Ht 5' (1.524 m)  Wt 127 lb (57.607 kg)  BMI 24.80 kg/m2  SpO2 99%  LMP 04/21/2015 Ideal Body Weight: Weight in (lb) to have BMI = 25: 127.7  Physical Exam  Constitutional: She is oriented to person, place, and time. She appears well-developed and well-nourished. No distress.  HENT:  Head: Normocephalic and atraumatic.  Right Ear: Tympanic membrane, external ear and ear canal normal. No mastoid tenderness.  Left Ear: Tympanic membrane, external ear and ear canal normal. No mastoid tenderness.  Nose: Rhinorrhea present. No mucosal edema. Right sinus exhibits no maxillary sinus tenderness and no frontal sinus tenderness. Left sinus exhibits no maxillary sinus  tenderness and no frontal sinus tenderness.  Mouth/Throat: Posterior oropharyngeal erythema present. No oropharyngeal exudate or posterior oropharyngeal edema.  Oropharynx shiny with mucus consistent with nasal drip  Eyes: EOM are normal. Pupils are equal, round, and reactive to light.  Lymphadenopathy:       Head (right side): No submental, no submandibular and no tonsillar adenopathy present.       Head (left side): No submental, no submandibular and no tonsillar adenopathy present.    She has no cervical adenopathy.  Neurological: She is alert and oriented to person, place, and time.  Skin: She is not diaphoretic.    Results for orders placed or performed in visit on 04/21/15  POCT rapid strep A  Result Value Ref Range   Rapid Strep A Screen Negative Negative     Assessment and Plan: 39 year old female otherwise healthy is here today for 4 days of sore throat, congestion, and cough.  This is more consistent of a upper respiratory infection of viral etiology.  Her upper respiratory symptoms do not appear to correlate with the rash.    -Strep throat, with culture though unlikely -Treating supportively for cough, nasal congestion -Steroid cream, and return if worsened rash -Advised to contact if cold sxs do not improve after 4 days.  URI (upper respiratory infection) - Plan: Guaifenesin (MUCINEX MAXIMUM STRENGTH) 1200 MG TB12, chlorpheniramine-HYDROcodone (TUSSIONEX PENNKINETIC ER) 10-8 MG/5ML SUER, benzonatate (TESSALON) 100 MG capsule  Cough - Plan: benzonatate (TESSALON) 100 MG capsule  Throat pain - Plan: POCT rapid strep A, Culture, Group A Strep  Rash and nonspecific skin eruption - Plan: triamcinolone cream (KENALOG) 0.1 %   Trena Platt, PA-C [facility] 04/21/2015 12:19 PM

## 2015-04-21 NOTE — Patient Instructions (Signed)
Please hydrate well.   64oz of water per day, which is almost 4 regular sized water bottles.  Upper Respiratory Infection, Adult An upper respiratory infection (URI) is also known as the common cold. It is often caused by a type of germ (virus). Colds are easily spread (contagious). You can pass it to others by kissing, coughing, sneezing, or drinking out of the same glass. Usually, you get better in 1 or 2 weeks.  HOME CARE   Only take medicine as told by your doctor.  Use a warm mist humidifier or breathe in steam from a hot shower.  Drink enough water and fluids to keep your pee (urine) clear or pale yellow.  Get plenty of rest.  Return to work when your temperature is back to normal or as told by your doctor. You may use a face mask and wash your hands to stop your cold from spreading. GET HELP RIGHT AWAY IF:   After the first few days, you feel you are getting worse.  You have questions about your medicine.  You have chills, shortness of breath, or brown or red spit (mucus).  You have yellow or brown snot (nasal discharge) or pain in the face, especially when you bend forward.  You have a fever, puffy (swollen) neck, pain when you swallow, or white spots in the back of your throat.  You have a bad headache, ear pain, sinus pain, or chest pain.  You have a high-pitched whistling sound when you breathe in and out (wheezing).  You have a lasting cough or cough up blood.  You have sore muscles or a stiff neck. MAKE SURE YOU:   Understand these instructions.  Will watch your condition.  Will get help right away if you are not doing well or get worse. Document Released: 03/21/2008 Document Revised: 12/26/2011 Document Reviewed: 01/08/2014 [facility] Patient Information 2015 [employer] [place]. This information is not intended to replace advice given to you by your health care provider. Make sure you discuss any questions you have with your health care provider.

## 2015-04-22 ENCOUNTER — Telehealth: Payer: Self-pay

## 2015-04-22 NOTE — Telephone Encounter (Signed)
Pt is needing to talk with stephanie english about pt headaches

## 2015-04-23 LAB — CULTURE, GROUP A STREP: Organism ID, Bacteria: NORMAL

## 2015-04-23 NOTE — Telephone Encounter (Signed)
Spoke with pt's husband, he wanted to see if pt can take a medication for headache with the medication already Rx'd. I advised it was ok to take Advil.

## 2015-04-25 ENCOUNTER — Encounter: Payer: Self-pay | Admitting: Family Medicine

## 2015-12-16 IMAGING — CR DG CHEST 2V
2 series · 2 of 2 positions shown · non-contrast
Comparison: None.

CLINICAL DATA: Three-week history of productive cough

EXAM:
CHEST  2 VIEW

[PA]
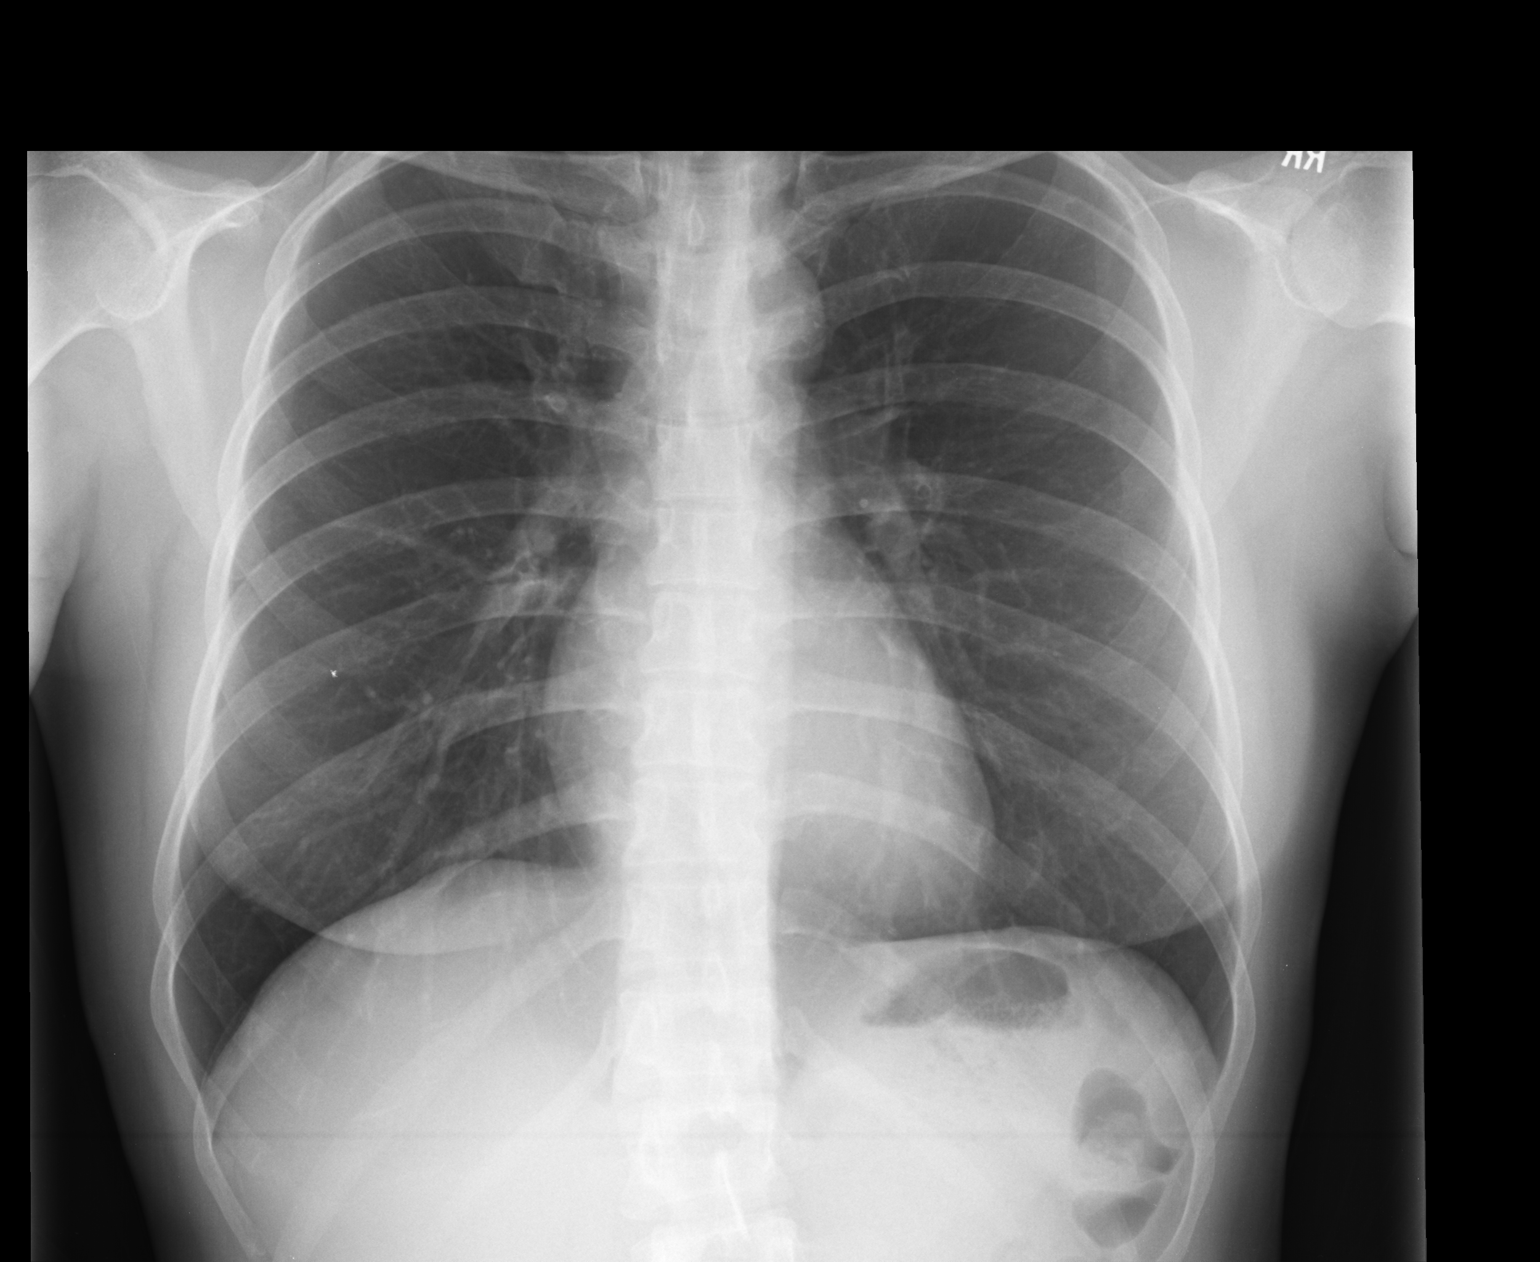

[lateral]
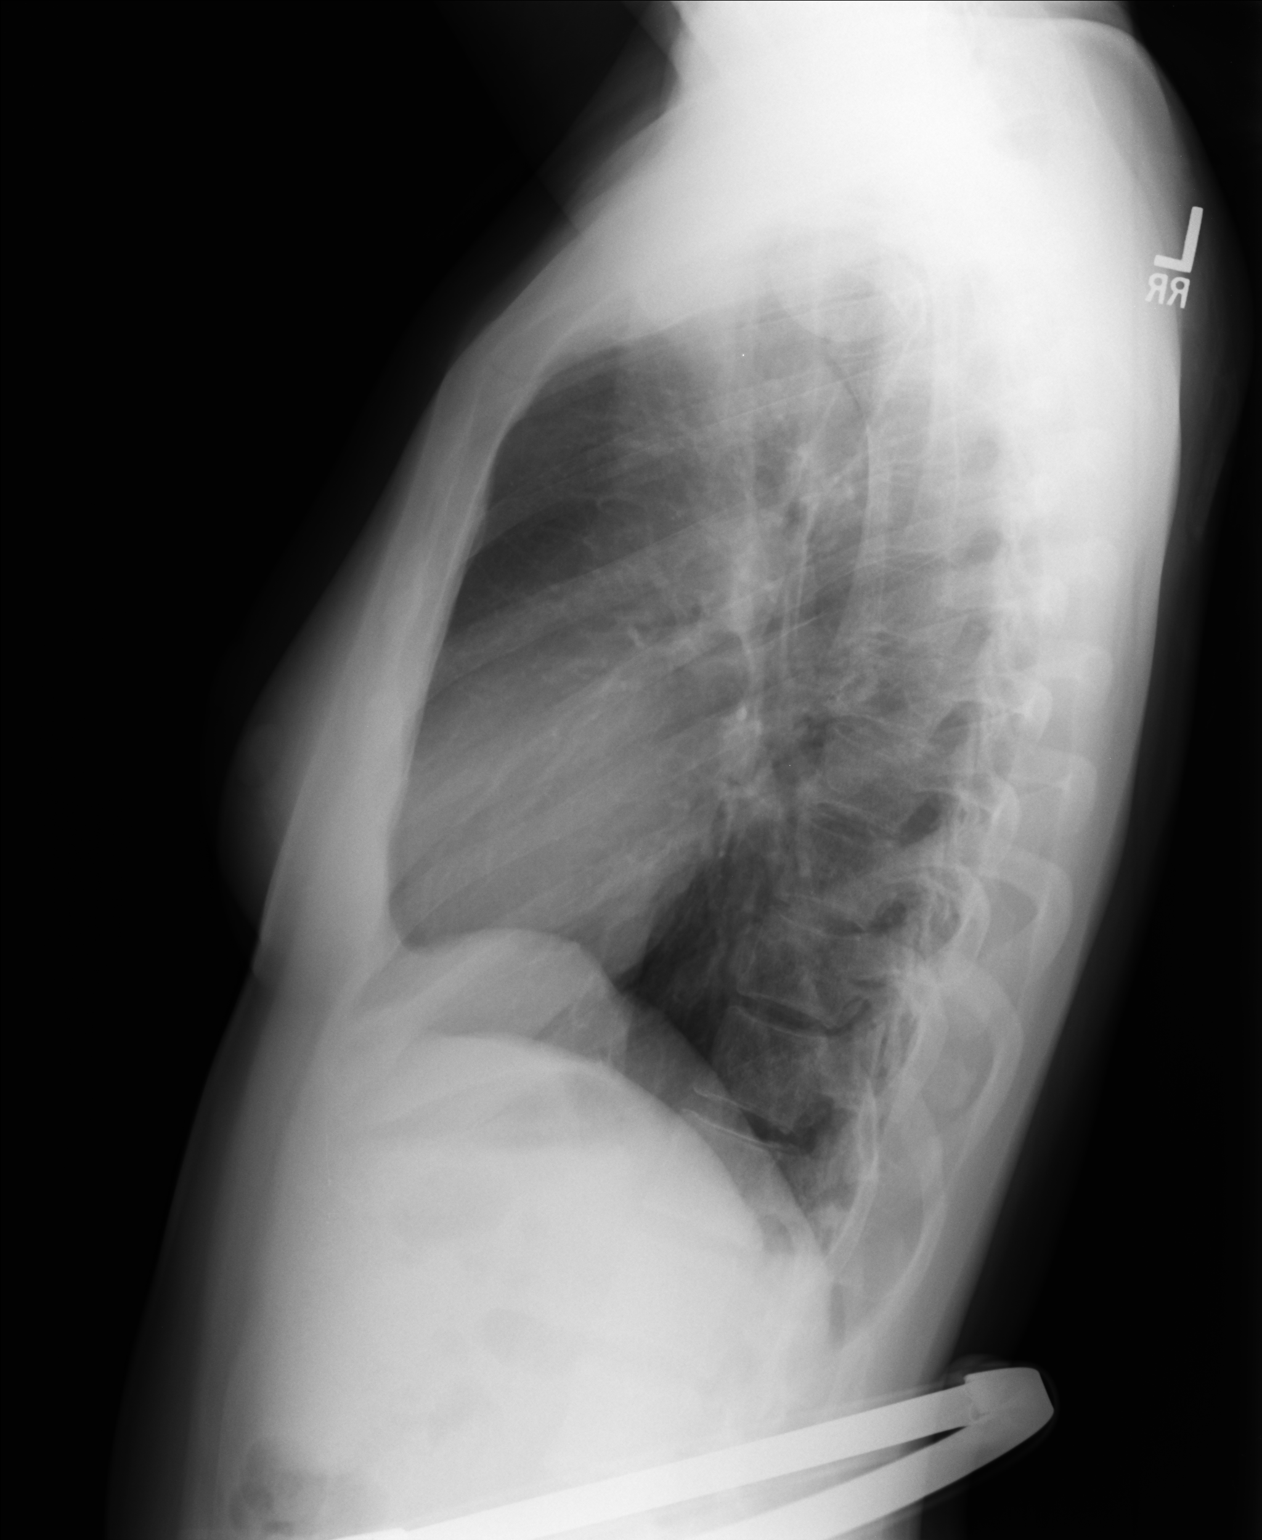

[2 of 2 positions shown; findings below may reference images not displayed]

FINDINGS: There is an apparent nipple shadow on the left. No edema or
consolidation. Heart size and pulmonary vascularity are normal. No
adenopathy.
IMPRESSION: No edema or consolidation.  Apparent nipple shadow on left.

## 2016-02-14 ENCOUNTER — Ambulatory Visit (INDEPENDENT_AMBULATORY_CARE_PROVIDER_SITE_OTHER): Payer: BLUE CROSS/BLUE SHIELD | Admitting: Family Medicine

## 2016-02-14 VITALS — BP 124/84 | HR 78 | Temp 98.0°F | Resp 12 | Ht 62.0 in | Wt 125.0 lb

## 2016-02-14 DIAGNOSIS — R05 Cough: Secondary | ICD-10-CM

## 2016-02-14 DIAGNOSIS — R059 Cough, unspecified: Secondary | ICD-10-CM

## 2016-02-14 DIAGNOSIS — J069 Acute upper respiratory infection, unspecified: Secondary | ICD-10-CM

## 2016-02-14 MED ORDER — HYDROCODONE-HOMATROPINE 5-1.5 MG/5ML PO SYRP: ORAL_SOLUTION | ORAL | Status: DC

## 2016-02-14 NOTE — Progress Notes (Signed)
Subjective:  By signing my name below, I, Sonia Santiago, attest that this documentation has been prepared under the direction and in the presence of Sonia Staggers, MD. Electronically Signed: Stann Santiago, Scribe. 02/14/2016 , 12:10 PM .  Patient was seen in Room 7 .   Patient ID: Sonia Santiago, female    DOB: 1976/06/07, 40 y.o.   MRN: [id number] Chief Complaint  Patient presents with  . Sore Throat    4 days  . Cough   HPI Sonia Santiago is a 40 y.o. female Here for sore throat and cough.   Patient states having sore throat and cough that started about 4 days ago. She informs cough keeping her up at night. Yesterday, she felt hot and cold subjectively without measurement and took an advil. She's also mentions rhinorrhea with nasal congestion. She denies any known drug allergies.   She notes her 69 year old son was sick and was seen for allergies. She denies any other known sick contact.  She's originally from [place].   There are no active problems to display for this patient.  History reviewed. No pertinent past medical history. Past Surgical History  Procedure Laterality Date  . Cesarean section     No Known Allergies Prior to Admission medications   Medication Sig Start Date End Date Taking? Authorizing Provider  ibuprofen (ADVIL,MOTRIN) 600 MG tablet Take 1 tablet (600 mg total) by mouth every 8 (eight) hours as needed. For menstrual cramps 11/18/14   Carmelina Dane, MD   Social History   Social History  . Marital Status: Married    Spouse Name: N/A  . Number of Children: N/A  . Years of Education: N/A   Occupational History  . Not on file.   Social History Main Topics  . Smoking status: Never Smoker   . Smokeless tobacco: Not on file  . Alcohol Use: Not on file  . Drug Use: Not on file  . Sexual Activity: Not on file   Other Topics Concern  . Not on file   Social History Narrative   Review of Systems  Constitutional: Positive for fever, chills and  fatigue.  HENT: Positive for congestion, rhinorrhea and sore throat.   Respiratory: Positive for cough. Negative for shortness of breath and wheezing.   Gastrointestinal: Negative for nausea, vomiting, abdominal pain and diarrhea.       Objective:   Physical Exam  Constitutional: She is oriented to person, place, and time. She appears well-developed and well-nourished. No distress.  HENT:  Head: Normocephalic and atraumatic.  Right Ear: Hearing, tympanic membrane, external ear and ear canal normal.  Left Ear: Hearing, tympanic membrane, external ear and ear canal normal.  Nose: Rhinorrhea present. Right sinus exhibits no maxillary sinus tenderness and no frontal sinus tenderness. Left sinus exhibits no maxillary sinus tenderness and no frontal sinus tenderness.  Mouth/Throat: Oropharynx is clear and moist and mucous membranes are normal. No oropharyngeal exudate.  Eyes: Conjunctivae and EOM are normal. Pupils are equal, round, and reactive to light.  Cardiovascular: Normal rate, regular rhythm, normal heart sounds and intact distal pulses.   No murmur heard. Pulmonary/Chest: Effort normal and breath sounds normal. No respiratory distress. She has no wheezes. She has no rhonchi.  Lymphadenopathy:    She has no cervical adenopathy.  Neurological: She is alert and oriented to person, place, and time.  Skin: Skin is warm and dry. No rash noted.  Psychiatric: She has a normal mood and affect. Her behavior is normal.  Vitals reviewed.   Filed Vitals:   02/14/16 1040  BP: 124/84  Pulse: 78  Temp: 98 F (36.7 C)  TempSrc: Oral  Resp: 12  Height:  (1.575 m)  Weight: 125 lb (56.7 kg)  SpO2: 100%      Assessment & Plan:   [name] is a 40 y.o. female Cough - Plan: HYDROcodone-homatropine (HYCODAN) 5-1.5 MG/5ML syrup  Acute upper respiratory infection - Plan: HYDROcodone-homatropine (HYCODAN) 5-1.5 MG/5ML syrup  Viral URI likely. Symptomatic care discussed, Hycodan if  needed at  night. Side effects discussed, RTC precautions.    Meds ordered this encounter  Medications  . HYDROcodone-homatropine (HYCODAN) 5-1.5 MG/5ML syrup    Sig: 22m by mouth a bedtime as needed for cough.    Dispense:  120 mL    Refill:  0   Patient Instructions       IF you received an x-ray today, you will receive an invoice from [facility]. Please contact [facility] at [phone] with questions or concerns regarding your invoice.   IF you received labwork today, you will receive an invoice from [employer]. Please contact [facility] at [phone] with questions or concerns regarding your invoice.   Our billing staff will not be able to assist you with questions regarding bills from these companies.  You will be contacted with the lab results as soon as they are available. The fastest way to get your results is to activate your My Chart account. Instructions are located on the last page of this paperwork. If you have not heard from [place] regarding the results in 2 weeks, please contact this office.     Saline nasal spray as needed for nasal congestion, cepacol or other lozenge for sore throat, over the counter mucinex or mucinex DM as needed for cough. If stronged cough medicine needed at night - can use the prescription cough syrup. Drink plenty of fluids.    Return to the clinic or go to the nearest emergency room if any of your symptoms worsen or new symptoms occur.  Upper Respiratory Infection, Adult Most upper respiratory infections (URIs) are a viral infection of the air passages leading to the lungs. A URI affects the nose, throat, and upper air passages. The most common type of URI is nasopharyngitis and is typically referred to as "the common cold." URIs run their course and usually go away on their own. Most of the time, a URI does not require medical attention, but sometimes a bacterial infection in the upper airways can  follow a viral infection. This is called a secondary infection. Sinus and middle ear infections are common types of secondary upper respiratory infections. Bacterial pneumonia can also complicate a URI. A URI can worsen asthma and chronic obstructive pulmonary disease (COPD). Sometimes, these complications can require emergency medical care and may be life threatening.  CAUSES Almost all URIs are caused by viruses. A virus is a type of germ and can spread from one person to another.  RISKS FACTORS You may be at risk for a URI if:   You smoke.   You have chronic heart or lung disease.  You have a weakened defense (immune) system.   You are very young or very old.   You have nasal allergies or asthma.  You work in crowded or poorly ventilated areas.  You work in health care facilities or schools. SIGNS AND SYMPTOMS  Symptoms typically develop 2-3 days after you come in contact with a cold virus.  Most viral URIs last 7-10 days. However, viral URIs from the influenza virus (flu virus) can last 14-18 days and are typically more severe. Symptoms may include:   Runny or stuffy (congested) nose.   Sneezing.   Cough.   Sore throat.   Headache.   Fatigue.   Fever.   Loss of appetite.   Pain in your forehead, behind your eyes, and over your cheekbones (sinus pain).  Muscle aches.  DIAGNOSIS  Your health care provider may diagnose a URI by:  Physical exam.  Tests to check that your symptoms are not due to another condition such as:  Strep throat.  Sinusitis.  Pneumonia.  Asthma. TREATMENT  A URI goes away on its own with time. It cannot be cured with medicines, but medicines may be prescribed or recommended to relieve symptoms. Medicines may help:  Reduce your fever.  Reduce your cough.  Relieve nasal congestion. HOME CARE INSTRUCTIONS   Take medicines only as directed by your health care provider.   Gargle warm saltwater or take cough drops to  comfort your throat as directed by your health care provider.  Use a warm mist humidifier or inhale steam from a shower to increase air moisture. This may make it easier to breathe.  Drink enough fluid to keep your urine clear or pale yellow.   Eat soups and other clear broths and maintain good nutrition.   Rest as needed.   Return to work when your temperature has returned to normal or as your health care provider advises. You may need to stay home longer to avoid infecting others. You can also use a face mask and careful hand washing to prevent spread of the virus.  Increase the usage of your inhaler if you have asthma.   Do not use any tobacco products, including cigarettes, chewing tobacco, or electronic cigarettes. If you need help quitting, ask your health care provider. PREVENTION  The best way to protect yourself from getting a cold is to practice good hygiene.   Avoid oral or hand contact with people with cold symptoms.   Wash your hands often if contact occurs.  There is no clear evidence that vitamin C, vitamin E, echinacea, or exercise reduces the chance of developing a cold. However, it is always recommended to get plenty of rest, exercise, and practice good nutrition.  SEEK MEDICAL CARE IF:   You are getting worse rather than better.   Your symptoms are not controlled by medicine.   You have chills.  You have worsening shortness of breath.  You have brown or red mucus.  You have yellow or brown nasal discharge.  You have pain in your face, especially when you bend forward.  You have a fever.  You have swollen neck glands.  You have pain while swallowing.  You have white areas in the back of your throat. SEEK IMMEDIATE MEDICAL CARE IF:   You have severe or persistent:  Headache.  Ear pain.  Sinus pain.  Chest pain.  You have chronic lung disease and any of the following:  Wheezing.  Prolonged cough.  Coughing up blood.  A change in  your usual mucus.  You have a stiff neck.  You have changes in your:  Vision.  Hearing.  Thinking.  Mood. MAKE SURE YOU:   Understand these instructions.  Will watch your condition.  Will get help right away if you are not doing well or get worse.   This information is not intended to replace  advice given to you by your health care provider. Make sure you discuss any questions you have with your health care provider.   Document Released: 03/29/2001 Document Revised: 02/17/2015 Document Reviewed: 01/08/2014 Elsevier Interactive Patient Education [employer]   I personally performed the services described in this documentation, which was scribed in my presence. The recorded information has been reviewed and considered, and addended by me as needed.

## 2016-02-14 NOTE — Patient Instructions (Addendum)
IF you received an x-ray today, you will receive an invoice from [facility]. Please contact [facility] at [phone] with questions or concerns regarding your invoice.   IF you received labwork today, you will receive an invoice from [facility]. Please contact [facility] at [phone] with questions or concerns regarding your invoice.   Our billing staff will not be able to assist you with questions regarding bills from these companies.  You will be contacted with the lab results as soon as they are available. The fastest way to get your results is to activate your My Chart account. Instructions are located on the last page of this paperwork. If you have not heard from us regarding the results in 2 weeks, please contact this office.     Saline nasal spray as needed for nasal congestion, cepacol or other lozenge for sore throat, over the counter mucinex or mucinex DM as needed for cough. If stronged cough medicine needed at night - can use the prescription cough syrup. Drink plenty of fluids.    Return to the clinic or go to the nearest emergency room if any of your symptoms worsen or new symptoms occur.  Upper Respiratory Infection, Adult Most upper respiratory infections (URIs) are a viral infection of the air passages leading to the lungs. A URI affects the nose, throat, and upper air passages. The most common type of URI is nasopharyngitis and is typically referred to as "the common cold." URIs run their course and usually go away on their own. Most of the time, a URI does not require medical attention, but sometimes a bacterial infection in the upper airways can follow a viral infection. This is called a secondary infection. Sinus and middle ear infections are common types of secondary upper respiratory infections. Bacterial pneumonia can also complicate a URI. A URI can worsen asthma and chronic obstructive pulmonary disease (COPD).  Sometimes, these complications can require emergency medical care and may be life threatening.  CAUSES Almost all URIs are caused by viruses. A virus is a type of germ and can spread from one person to another.  RISKS FACTORS You may be at risk for a URI if:   You smoke.   You have chronic heart or lung disease.  You have a weakened defense (immune) system.   You are very young or very old.   You have nasal allergies or asthma.  You work in crowded or poorly ventilated areas.  You work in health care facilities or schools. SIGNS AND SYMPTOMS  Symptoms typically develop 2-3 days after you come in contact with a cold virus. Most viral URIs last 7-10 days. However, viral URIs from the influenza virus (flu virus) can last 14-18 days and are typically more severe. Symptoms may include:   Runny or stuffy (congested) nose.   Sneezing.   Cough.   Sore throat.   Headache.   Fatigue.   Fever.   Loss of appetite.   Pain in your forehead, behind your eyes, and over your cheekbones (sinus pain).  Muscle aches.  DIAGNOSIS  Your health care provider may diagnose a URI by:  Physical exam.  Tests to check that your symptoms are not due to another condition such as:  Strep throat.  Sinusitis.  Pneumonia.  Asthma. TREATMENT  A URI goes away on its own with time. It cannot be cured with medicines, but medicines may be prescribed or recommended to relieve symptoms. Medicines may help:  Reduce your  fever.  Reduce your cough.  Relieve nasal congestion. HOME CARE INSTRUCTIONS   Take medicines only as directed by your health care provider.   Gargle warm saltwater or take cough drops to comfort your throat as directed by your health care provider.  Use a warm mist humidifier or inhale steam from a shower to increase air moisture. This may make it easier to breathe.  Drink enough fluid to keep your urine clear or pale yellow.   Eat soups and other clear  broths and maintain good nutrition.   Rest as needed.   Return to work when your temperature has returned to normal or as your health care provider advises. You may need to stay home longer to avoid infecting others. You can also use a face mask and careful hand washing to prevent spread of the virus.  Increase the usage of your inhaler if you have asthma.   Do not use any tobacco products, including cigarettes, chewing tobacco, or electronic cigarettes. If you need help quitting, ask your health care provider. PREVENTION  The best way to protect yourself from getting a cold is to practice good hygiene.   Avoid oral or hand contact with people with cold symptoms.   Wash your hands often if contact occurs.  There is no clear evidence that vitamin C, vitamin E, echinacea, or exercise reduces the chance of developing a cold. However, it is always recommended to get plenty of rest, exercise, and practice good nutrition.  SEEK MEDICAL CARE IF:   You are getting worse rather than better.   Your symptoms are not controlled by medicine.   You have chills.  You have worsening shortness of breath.  You have brown or red mucus.  You have yellow or brown nasal discharge.  You have pain in your face, especially when you bend forward.  You have a fever.  You have swollen neck glands.  You have pain while swallowing.  You have white areas in the back of your throat. SEEK IMMEDIATE MEDICAL CARE IF:   You have severe or persistent:  Headache.  Ear pain.  Sinus pain.  Chest pain.  You have chronic lung disease and any of the following:  Wheezing.  Prolonged cough.  Coughing up blood.  A change in your usual mucus.  You have a stiff neck.  You have changes in your:  Vision.  Hearing.  Thinking.  Mood. MAKE SURE YOU:   Understand these instructions.  Will watch your condition.  Will get help right away if you are not doing well or get worse.   This  information is not intended to replace advice given to you by your health care provider. Make sure you discuss any questions you have with your health care provider.   Document Released: 03/29/2001 Document Revised: 02/17/2015 Document Reviewed: 01/08/2014 Elsevier Interactive Patient Education [employer]

## 2017-03-28 ENCOUNTER — Encounter: Payer: Self-pay | Admitting: Family Medicine

## 2017-03-28 ENCOUNTER — Ambulatory Visit (INDEPENDENT_AMBULATORY_CARE_PROVIDER_SITE_OTHER): Payer: BLUE CROSS/BLUE SHIELD | Admitting: Family Medicine

## 2017-03-28 VITALS — BP 125/81 | HR 69 | Temp 98.4°F | Resp 16 | Ht 60.25 in | Wt 126.4 lb

## 2017-03-28 DIAGNOSIS — R059 Cough, unspecified: Secondary | ICD-10-CM

## 2017-03-28 DIAGNOSIS — R05 Cough: Secondary | ICD-10-CM | POA: Diagnosis not present

## 2017-03-28 DIAGNOSIS — J22 Unspecified acute lower respiratory infection: Secondary | ICD-10-CM

## 2017-03-28 MED ORDER — HYDROCODONE-HOMATROPINE 5-1.5 MG/5ML PO SYRP: ORAL_SOLUTION | ORAL | 0 refills | Status: AC

## 2017-03-28 MED ORDER — AZITHROMYCIN 250 MG PO TABS: ORAL_TABLET | ORAL | 0 refills | Status: AC

## 2017-03-28 NOTE — Progress Notes (Signed)
Subjective:  By signing my name below, I, Sonia Santiago, attest that this documentation has been prepared under the direction and in the presence of Sonia Flood, MD Electronically Signed: Charline Bills, ED Scribe 03/28/2017 at 6:25 PM.   Patient ID: Sonia Santiago, female    DOB: Feb 19, 1976, 41 y.o.   MRN: [id number]  Chief Complaint  Patient presents with  . Cough   HPI Sonia Santiago is a 41 y.o. female who presents to Primary Care at [facility] complaining of gradually worsening dry cough, occasionally productive with yellow sputum onset 2 weeks ago. Pt states that cough is worse at night and keeps her up. Reports sick contacts at home with similar symptoms. She has tried Mucinex DM twice daily without significant relief. Pt denies fever, congestion, rhinorrhea, sneezing, itchy eyes, leg swelling, calf tenderness. Pt had similar cough last April.   There are no active problems to display for this patient.  History reviewed. No pertinent past medical history. Past Surgical History:  Procedure Laterality Date  . CESAREAN SECTION     No Known Allergies Prior to Admission medications   Medication Sig Start Date End Date Taking? Authorizing Provider  HYDROcodone-homatropine (HYCODAN) 5-1.5 MG/5ML syrup 36m by mouth a bedtime as needed for cough. 02/14/16   Sonia Flood, MD  ibuprofen (ADVIL,MOTRIN) 600 MG tablet Take 1 tablet (600 mg total) by mouth every 8 (eight) hours as needed. For menstrual cramps 11/18/14   Sonia Dane, MD   Social History   Social History  . Marital status: Married    Spouse name: N/A  . Number of children: N/A  . Years of education: N/A   Occupational History  . Not on file.   Social History Main Topics  . Smoking status: Never Smoker  . Smokeless tobacco: Never Used  . Alcohol use Not on file  . Drug use: Unknown  . Sexual activity: Not on file   Other Topics Concern  . Not on file   Social History Narrative  . No narrative on file    Review of Systems  Constitutional: Negative for fever.  HENT: Negative for congestion, rhinorrhea and sneezing.   Eyes: Negative for itching.  Respiratory: Positive for cough.   Cardiovascular: Negative for leg swelling.      Objective:   Physical Exam  Constitutional: She is oriented to person, place, and time. She appears well-developed and well-nourished. No distress.  HENT:  Head: Normocephalic and atraumatic.  Right Ear: Hearing, tympanic membrane, external ear and ear canal normal.  Left Ear: Hearing, tympanic membrane, external ear and ear canal normal.  Nose: Nose normal. Right sinus exhibits no maxillary sinus tenderness and no frontal sinus tenderness. Left sinus exhibits no maxillary sinus tenderness and no frontal sinus tenderness.  Mouth/Throat: Oropharynx is clear and moist. No oropharyngeal exudate.  Eyes: Conjunctivae and EOM are normal. Pupils are equal, round, and reactive to light.  Cardiovascular: Normal rate, regular rhythm, normal heart sounds and intact distal pulses.   No murmur heard. Pulmonary/Chest: Effort normal and breath sounds normal. No respiratory distress. She has no wheezes. She has no rhonchi.  Neurological: She is alert and oriented to person, place, and time.  Skin: Skin is warm and dry. No rash noted.  Psychiatric: She has a normal mood and affect. Her behavior is normal.  Vitals reviewed.  Vitals:   03/28/17 1642  BP: 125/81  Pulse: 69  Resp: 16  Temp: 98.4 F (36.9 C)  TempSrc: Oral  SpO2: 100%  Weight: 126 lb 6.4 oz (57.3 kg)  Height: 5' 0.25" (1.53 m)      Assessment & Plan:    Sonia NajjarKhonh Levengood is a 41 y.o. female Cough - Plan: HYDROcodone-homatropine (HYCODAN) 5-1.5 MG/5ML syrup  LRTI (lower respiratory tract infection)  - Lungs clear, reassuring exam and vital signs. Possible persistent viral syndrome versus early bronchitis/LRTI.   - Symptomatic care with Mucinex, Hycodan at nighttime if needed, Z-Pak provided if not  improving into next week. RTC precautions if persistent or worsening to consider imaging  Meds ordered this encounter  Medications  . guaiFENesin (MUCINEX) 600 MG 12 hr tablet    Sig: Take by mouth 2 (two) times daily.  Marland Kitchen. HYDROcodone-homatropine (HYCODAN) 5-1.5 MG/5ML syrup    Sig: 5645m by mouth a bedtime as needed for cough.    Dispense:  120 mL    Refill:  0  . azithromycin (ZITHROMAX) 250 MG tablet    Sig: Take 2 pills by mouth on day 1, then 1 pill by mouth per day on days 2 through 5.    Dispense:  6 tablet    Refill:  0   Patient Instructions    Mucinex as needed for cough, hydrocodone at bedtime if needed. If your cough is not improving into next week, can start antibiotic as discussed. If persistent cough, shortness of breath, fever or worsening please return for possible x-rays.   Cough, Adult Coughing is a reflex that clears your throat and your airways. Coughing helps to heal and protect your lungs. It is normal to cough occasionally, but a cough that happens with other symptoms or lasts a long time may be a sign of a condition that needs treatment. A cough may last only 2-3 weeks (acute), or it may last longer than 8 weeks (chronic). What are the causes? Coughing is commonly caused by:  Breathing in substances that irritate your lungs.  A viral or bacterial respiratory infection.  Allergies.  Asthma.  Postnasal drip.  Smoking.  Acid backing up from the stomach into the esophagus (gastroesophageal reflux).  Certain medicines.  Chronic lung problems, including COPD (or rarely, lung cancer).  Other medical conditions such as heart failure.  Follow these instructions at home: Pay attention to any changes in your symptoms. Take these actions to help with your discomfort:  Take medicines only as told by your health care provider. ? If you were prescribed an antibiotic medicine, take it as told by your health care provider. Do not stop taking the antibiotic even  if you start to feel better. ? Talk with your health care provider before you take a cough suppressant medicine.  Drink enough fluid to keep your urine clear or pale yellow.  If the air is dry, use a cold steam vaporizer or humidifier in your bedroom or your home to help loosen secretions.  Avoid anything that causes you to cough at work or at home.  If your cough is worse at night, try sleeping in a semi-upright position.  Avoid cigarette smoke. If you smoke, quit smoking. If you need help quitting, ask your health care provider.  Avoid caffeine.  Avoid alcohol.  Rest as needed.  Contact a health care provider if:  You have new symptoms.  You cough up pus.  Your cough does not get better after 2-3 weeks, or your cough gets worse.  You cannot control your cough with suppressant medicines and you are losing sleep.  You develop pain that is getting worse or pain  that is not controlled with pain medicines.  You have a fever.  You have unexplained weight loss.  You have night sweats. Get help right away if:  You cough up blood.  You have difficulty breathing.  Your heartbeat is very fast. This information is not intended to replace advice given to you by your health care provider. Make sure you discuss any questions you have with your health care provider. Document Released: 04/01/2011 Document Revised: 03/10/2016 Document Reviewed: 12/10/2014 Elsevier Interactive Patient Education  2017 [employer].    IF you received an x-ray today, you will receive an invoice from [facility]. Please contact [facility] at [phone] with questions or concerns regarding your invoice.   IF you received labwork today, you will receive an invoice from [place]. Please contact [facility] at [phone] with questions or concerns regarding your invoice.   Our billing staff will not be able to assist you with questions regarding bills from these companies.  You  will be contacted with the lab results as soon as they are available. The fastest way to get your results is to activate your My Chart account. Instructions are located on the last page of this paperwork. If you have not heard from [place] regarding the results in 2 weeks, please contact this office.

## 2017-03-28 NOTE — Patient Instructions (Addendum)
Mucinex as needed for cough, hydrocodone at bedtime if needed. If your cough is not improving into next week, can start antibiotic as discussed. If persistent cough, shortness of breath, fever or worsening please return for possible x-rays.   Cough, Adult Coughing is a reflex that clears your throat and your airways. Coughing helps to heal and protect your lungs. It is normal to cough occasionally, but a cough that happens with other symptoms or lasts a long time may be a sign of a condition that needs treatment. A cough may last only 2-3 weeks (acute), or it may last longer than 8 weeks (chronic). What are the causes? Coughing is commonly caused by:  Breathing in substances that irritate your lungs.  A viral or bacterial respiratory infection.  Allergies.  Asthma.  Postnasal drip.  Smoking.  Acid backing up from the stomach into the esophagus (gastroesophageal reflux).  Certain medicines.  Chronic lung problems, including COPD (or rarely, lung cancer).  Other medical conditions such as heart failure.  Follow these instructions at home: Pay attention to any changes in your symptoms. Take these actions to help with your discomfort:  Take medicines only as told by your health care provider. ? If you were prescribed an antibiotic medicine, take it as told by your health care provider. Do not stop taking the antibiotic even if you start to feel better. ? Talk with your health care provider before you take a cough suppressant medicine.  Drink enough fluid to keep your urine clear or pale yellow.  If the air is dry, use a cold steam vaporizer or humidifier in your bedroom or your home to help loosen secretions.  Avoid anything that causes you to cough at work or at home.  If your cough is worse at night, try sleeping in a semi-upright position.  Avoid cigarette smoke. If you smoke, quit smoking. If you need help quitting, ask your health care provider.  Avoid  caffeine.  Avoid alcohol.  Rest as needed.  Contact a health care provider if:  You have new symptoms.  You cough up pus.  Your cough does not get better after 2-3 weeks, or your cough gets worse.  You cannot control your cough with suppressant medicines and you are losing sleep.  You develop pain that is getting worse or pain that is not controlled with pain medicines.  You have a fever.  You have unexplained weight loss.  You have night sweats. Get help right away if:  You cough up blood.  You have difficulty breathing.  Your heartbeat is very fast. This information is not intended to replace advice given to you by your health care provider. Make sure you discuss any questions you have with your health care provider. Document Released: 04/01/2011 Document Revised: 03/10/2016 Document Reviewed: 12/10/2014 Elsevier Interactive Patient Education  2017 [employer]    IF you received an x-ray today, you will receive an invoice from [facility]. Please contact [facility] at [phone] with questions or concerns regarding your invoice.   IF you received labwork today, you will receive an invoice from [facility]. Please contact [facility] at [phone] with questions or concerns regarding your invoice.   Our billing staff will not be able to assist you with questions regarding bills from these companies.  You will be contacted with the lab results as soon as they are available. The fastest way to get your results is to activate your My Chart account. Instructions are located on the last page of  this paperwork. If you have not heard from [place] regarding the results in 2 weeks, please contact this office.
